# Patient Record
Sex: Female | Born: 2005
Health system: Southern US, Community
[De-identification: ages and names within clinical notes are randomized; demographics above are authoritative.]

## PROBLEM LIST (undated history)

## (undated) DIAGNOSIS — K5900 Constipation, unspecified: Secondary | ICD-10-CM

---
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
## 2023-01-03 DIAGNOSIS — M25562 Pain in left knee: Secondary | ICD-10-CM | POA: Diagnosis not present

## 2023-01-10 DIAGNOSIS — M25562 Pain in left knee: Secondary | ICD-10-CM | POA: Diagnosis not present

## 2023-01-13 DIAGNOSIS — M25562 Pain in left knee: Secondary | ICD-10-CM | POA: Diagnosis not present

## 2023-01-17 DIAGNOSIS — M25562 Pain in left knee: Secondary | ICD-10-CM | POA: Diagnosis not present

## 2023-01-20 DIAGNOSIS — M25562 Pain in left knee: Secondary | ICD-10-CM | POA: Diagnosis not present

## 2023-01-22 DIAGNOSIS — M25562 Pain in left knee: Secondary | ICD-10-CM | POA: Diagnosis not present

## 2023-01-28 DIAGNOSIS — M25562 Pain in left knee: Secondary | ICD-10-CM | POA: Diagnosis not present

## 2023-01-31 DIAGNOSIS — M25562 Pain in left knee: Secondary | ICD-10-CM | POA: Diagnosis not present

## 2023-02-04 DIAGNOSIS — Z4789 Encounter for other orthopedic aftercare: Secondary | ICD-10-CM | POA: Diagnosis not present

## 2023-02-07 DIAGNOSIS — J00 Acute nasopharyngitis [common cold]: Secondary | ICD-10-CM | POA: Diagnosis not present

## 2023-02-07 DIAGNOSIS — J309 Allergic rhinitis, unspecified: Secondary | ICD-10-CM | POA: Diagnosis not present

## 2023-02-07 DIAGNOSIS — K589 Irritable bowel syndrome without diarrhea: Secondary | ICD-10-CM | POA: Diagnosis not present

## 2023-02-07 DIAGNOSIS — M25562 Pain in left knee: Secondary | ICD-10-CM | POA: Diagnosis not present

## 2023-02-12 DIAGNOSIS — M25562 Pain in left knee: Secondary | ICD-10-CM | POA: Diagnosis not present

## 2023-02-14 DIAGNOSIS — M25562 Pain in left knee: Secondary | ICD-10-CM | POA: Diagnosis not present

## 2023-02-18 DIAGNOSIS — M25562 Pain in left knee: Secondary | ICD-10-CM | POA: Diagnosis not present

## 2023-02-21 ENCOUNTER — Encounter: Payer: Self-pay | Admitting: Gastroenterology

## 2023-02-21 DIAGNOSIS — M25562 Pain in left knee: Secondary | ICD-10-CM | POA: Diagnosis not present

## 2023-02-25 DIAGNOSIS — M25562 Pain in left knee: Secondary | ICD-10-CM | POA: Diagnosis not present

## 2023-02-28 DIAGNOSIS — M25562 Pain in left knee: Secondary | ICD-10-CM | POA: Diagnosis not present

## 2023-03-03 DIAGNOSIS — R5382 Chronic fatigue, unspecified: Secondary | ICD-10-CM | POA: Diagnosis not present

## 2023-03-03 DIAGNOSIS — N926 Irregular menstruation, unspecified: Secondary | ICD-10-CM | POA: Diagnosis not present

## 2023-03-03 DIAGNOSIS — E559 Vitamin D deficiency, unspecified: Secondary | ICD-10-CM | POA: Diagnosis not present

## 2023-03-03 DIAGNOSIS — Z0000 Encounter for general adult medical examination without abnormal findings: Secondary | ICD-10-CM | POA: Diagnosis not present

## 2023-03-03 DIAGNOSIS — K589 Irritable bowel syndrome without diarrhea: Secondary | ICD-10-CM | POA: Diagnosis not present

## 2023-03-04 DIAGNOSIS — M25562 Pain in left knee: Secondary | ICD-10-CM | POA: Diagnosis not present

## 2023-03-06 DIAGNOSIS — M25562 Pain in left knee: Secondary | ICD-10-CM | POA: Diagnosis not present

## 2023-03-11 DIAGNOSIS — M25562 Pain in left knee: Secondary | ICD-10-CM | POA: Diagnosis not present

## 2023-03-13 ENCOUNTER — Emergency Department (HOSPITAL_BASED_OUTPATIENT_CLINIC_OR_DEPARTMENT_OTHER): Payer: BC Managed Care – PPO

## 2023-03-13 ENCOUNTER — Emergency Department (HOSPITAL_BASED_OUTPATIENT_CLINIC_OR_DEPARTMENT_OTHER)
Admission: EM | Admit: 2023-03-13 | Discharge: 2023-03-14 | Disposition: A | Discharge status: Home / Self Care | Payer: BC Managed Care – PPO | Attending: Emergency Medicine | Admitting: Emergency Medicine

## 2023-03-13 ENCOUNTER — Encounter (HOSPITAL_BASED_OUTPATIENT_CLINIC_OR_DEPARTMENT_OTHER): Payer: Self-pay

## 2023-03-13 ENCOUNTER — Other Ambulatory Visit: Payer: Self-pay

## 2023-03-13 DIAGNOSIS — R109 Unspecified abdominal pain: Secondary | ICD-10-CM | POA: Diagnosis not present

## 2023-03-13 DIAGNOSIS — K219 Gastro-esophageal reflux disease without esophagitis: Secondary | ICD-10-CM | POA: Insufficient documentation

## 2023-03-13 DIAGNOSIS — M25562 Pain in left knee: Secondary | ICD-10-CM | POA: Diagnosis not present

## 2023-03-13 DIAGNOSIS — R1031 Right lower quadrant pain: Secondary | ICD-10-CM | POA: Diagnosis not present

## 2023-03-13 LAB — CBC WITH DIFFERENTIAL/PLATELET
Abs Immature Granulocytes: 0.01 10*3/uL (ref 0.00–0.07)
Basophils Absolute: 0 10*3/uL (ref 0.0–0.1)
Basophils Relative: 0 %
Eosinophils Absolute: 0.1 10*3/uL (ref 0.0–0.5)
Eosinophils Relative: 1 %
HCT: 42.7 % (ref 36.0–46.0)
Hemoglobin: 14.5 g/dL (ref 12.0–15.0)
Immature Granulocytes: 0 %
Lymphocytes Relative: 38 %
Lymphs Abs: 2.3 10*3/uL (ref 0.7–4.0)
MCH: 28 pg (ref 26.0–34.0)
MCHC: 34 g/dL (ref 30.0–36.0)
MCV: 82.4 fL (ref 80.0–100.0)
Monocytes Absolute: 0.4 10*3/uL (ref 0.1–1.0)
Monocytes Relative: 7 %
Neutro Abs: 3.3 10*3/uL (ref 1.7–7.7)
Neutrophils Relative %: 54 %
Platelets: 252 10*3/uL (ref 150–400)
RBC: 5.18 MIL/uL — ABNORMAL HIGH (ref 3.87–5.11)
RDW: 11.9 % (ref 11.5–15.5)
WBC: 6.2 10*3/uL (ref 4.0–10.5)
nRBC: 0 % (ref 0.0–0.2)

## 2023-03-13 LAB — URINALYSIS, MICROSCOPIC (REFLEX): WBC, UA: NONE SEEN WBC/hpf (ref 0–5)

## 2023-03-13 LAB — URINALYSIS, ROUTINE W REFLEX MICROSCOPIC
Bilirubin Urine: NEGATIVE
Glucose, UA: NEGATIVE mg/dL
Ketones, ur: NEGATIVE mg/dL
Leukocytes,Ua: NEGATIVE
Nitrite: NEGATIVE
Protein, ur: NEGATIVE mg/dL
Specific Gravity, Urine: 1.01 (ref 1.005–1.030)
pH: 6.5 (ref 5.0–8.0)

## 2023-03-13 LAB — COMPREHENSIVE METABOLIC PANEL
ALT: 13 U/L (ref 0–44)
AST: 19 U/L (ref 15–41)
Albumin: 4.5 g/dL (ref 3.5–5.0)
Alkaline Phosphatase: 54 U/L (ref 38–126)
Anion gap: 10 (ref 5–15)
BUN: 10 mg/dL (ref 6–20)
CO2: 24 mmol/L (ref 22–32)
Calcium: 9.3 mg/dL (ref 8.9–10.3)
Chloride: 105 mmol/L (ref 98–111)
Creatinine, Ser: 0.63 mg/dL (ref 0.44–1.00)
GFR, Estimated: >60 mL/min (ref >60)
Glucose, Bld: 111 mg/dL — ABNORMAL HIGH (ref 70–99)
Potassium: 3.7 mmol/L (ref 3.5–5.1)
Sodium: 139 mmol/L (ref 135–145)
Total Bilirubin: 0.5 mg/dL (ref 0.3–1.2)
Total Protein: 7.7 g/dL (ref 6.5–8.1)

## 2023-03-13 LAB — LIPASE, BLOOD: Lipase: 39 U/L (ref 11–51)

## 2023-03-13 LAB — PREGNANCY, URINE: Preg Test, Ur: NEGATIVE

## 2023-03-13 MED ORDER — IOHEXOL 300 MG/ML  SOLN
100.0000 mL | Freq: Once | INTRAMUSCULAR | Status: AC | PRN
  Administered 2023-03-13: 100 mL via INTRAVENOUS

## 2023-03-13 MED ORDER — ONDANSETRON 4 MG PO TBDP
4.0000 mg | ORAL_TABLET | Freq: Once | ORAL | Status: AC
  Administered 2023-03-13: 4 mg via ORAL
  Filled 2023-03-13: qty 1

## 2023-03-13 MED ORDER — ALUM & MAG HYDROXIDE-SIMETH 200-200-20 MG/5ML PO SUSP
30.0000 mL | Freq: Once | ORAL | Status: AC
  Administered 2023-03-13: 30 mL via ORAL
  Filled 2023-03-13: qty 30

## 2023-03-13 NOTE — ED Provider Notes (Signed)
[place] EMERGENCY DEPARTMENT AT [facility]  Provider Note  CSN: [id number] Arrival date & time: 03/13/23 2100  History Chief Complaint  Patient presents with   Abdominal Pain    Kelly Mcintyre is a 17 y.o. female with prior history of ovarian cyst (at 17yo), GERD and constipation has had increasing abdominal issues and irregular periods for some time. She has seen PCP and referred to GI and Ob/Gyn for further evaluation. She reports in the last 3 days she has had moderate aching RLQ abdominal discomfort as well as burning epigastric pain moving into her chest, worse when she lies down and keeping her from sleeping well at night. She has felt 'shaky' at times. No fevers, no vomiting, no diarrhea. No dysuria or hematuria. She is currently on her menses.    Home Medications Prior to Admission medications   Medication Sig Start Date End Date Taking? Authorizing Provider  Dapsone 5 % topical gel Apply daily to the face 03/18/18   [provider]  ISOtretinoin (ACCUTANE) 40 MG capsule Take by mouth. 10/04/20   [provider]  ketoconazole (NIZORAL) 2 % shampoo Apply topically. 03/19/18   [provider]  loratadine (CLARITIN) 10 MG tablet Take by mouth.    [provider]  tretinoin (RETIN-A) 0.025 % cream  07/21/18   [provider]     Allergies    Patient has no known allergies.   Review of Systems   Review of Systems Please see HPI for pertinent positives and negatives  Physical Exam BP 113/74   Pulse 81   Temp 98.1 F (36.7 C) (Oral)   Resp 18   Ht 5\' 5"  (1.651 m)   Wt 64.4 kg   SpO2 100%   BMI 23.63 kg/m   Physical Exam Vitals and nursing note reviewed.  Constitutional:      Appearance: Normal appearance.  HENT:     Head: Normocephalic and atraumatic.     Nose: Nose normal.     Mouth/Throat:     Mouth: Mucous membranes are moist.  Eyes:     Extraocular Movements: Extraocular movements intact.      Conjunctiva/sclera: Conjunctivae normal.  Cardiovascular:     Rate and Rhythm: Normal rate.  Pulmonary:     Effort: Pulmonary effort is normal.     Breath sounds: Normal breath sounds.  Abdominal:     General: Abdomen is flat.     Palpations: Abdomen is soft.     Tenderness: There is abdominal tenderness in the right lower quadrant. There is no guarding. Negative signs include Murphy's sign and McBurney's sign.  Musculoskeletal:        General: No swelling. Normal range of motion.     Cervical back: Neck supple.  Skin:    General: Skin is warm and dry.  Neurological:     General: No focal deficit present.     Mental Status: She is alert.  Psychiatric:        Mood and Affect: Mood normal.     ED Results / Procedures / Treatments   EKG EKG Interpretation  Date/Time:  Thursday March 13 2023 21:35:14 EDT Ventricular Rate:  99 PR Interval:  130 QRS Duration: 60 QT Interval:  315 QTC Calculation: 405 R Axis:   83 Text Interpretation: Sinus rhythm Borderline ST elevation, inferior leads No significant change since last tracing Confirmed by Susy Frizzle [phone]) on 03/13/2023 11:18:02 PM  Procedures Procedures  Medications Ordered in the ED Medications  ondansetron (ZOFRAN-ODT) disintegrating tablet 4 mg (4 mg Oral Given 03/13/23 2143)  alum & mag hydroxide-simeth (MAALOX/MYLANTA) 200-200-20 MG/5ML suspension 30 mL (30 mLs Oral Given 03/13/23 2330)  iohexol (OMNIPAQUE) 300 MG/ML solution 100 mL (100 mLs Intravenous Contrast Given 03/13/23 2345)    Initial Impression and Plan  Patient here with multiple complaints, some of which are more chronic or subacute such as her GERD symptoms and shakiness. She has had thyroid checked at PCP, reportedly normal. Awaiting outpatient GI and Ob/Gyn follow up for further investigation. She mentions she has not taken her PPI for several days and symptoms seemed to have worsened. Her more acute concern tonight is RLQ pain, some tenderness but no  peritoneal signs. Labs done in triage show normal CBC, CMP and Lipase. HCG is neg. Will send for CT to eval appendicitis or ovarian cyst. GI cocktail for symptom relief.   ED Course   Clinical Course as of 03/14/23 0046  Thu Mar 13, 2023  2339 UA with some blood, no infection, likely due to menses.  [CS]  Fri Mar 14, 2023  2440 I personally viewed the images from radiology studies and agree with radiologist interpretation:  CT neg for acute process. Patient aware of diverticulosis. She was encouraged to restart PPI, OTC antacids as needed. PCP follow up, RTED for any other concerns.   [CS]    Clinical Course User Index [CS] Pollyann Savoy, MD     MDM Rules/Calculators/A&P Medical Decision Making Problems Addressed: Gastroesophageal reflux disease, unspecified whether esophagitis present: chronic illness or injury with exacerbation, progression, or side effects of treatment Right lower quadrant abdominal pain: acute illness or injury  Amount and/or Complexity of Data Reviewed Labs: ordered. Decision-making details documented in ED Course. Radiology: ordered and independent interpretation performed. Decision-making details documented in ED Course. ECG/medicine tests: ordered and independent interpretation performed. Decision-making details documented in ED Course.  Risk OTC drugs. Prescription drug management.     Final Clinical Impression(s) / ED Diagnoses Final diagnoses:  Right lower quadrant abdominal pain  Gastroesophageal reflux disease, unspecified whether esophagitis present    Rx / DC Orders ED Discharge Orders     None        Pollyann Savoy, MD 03/14/23 [phone]

## 2023-03-13 NOTE — ED Notes (Signed)
Pt unable to void at this time. 

## 2023-03-13 NOTE — ED Triage Notes (Signed)
Pt reports epigastric pain (worse while laying on back), nausea, acid reflux, hands feeling shaky, and inability to sleep for the past 3 days. Pt also reports being on her menstrual cycle and having right lower abdominal pain. Pt reports alternating hot/cold flashes.

## 2023-03-20 DIAGNOSIS — M25562 Pain in left knee: Secondary | ICD-10-CM | POA: Diagnosis not present

## 2023-03-24 DIAGNOSIS — F419 Anxiety disorder, unspecified: Secondary | ICD-10-CM | POA: Diagnosis not present

## 2023-03-24 DIAGNOSIS — M25562 Pain in left knee: Secondary | ICD-10-CM | POA: Diagnosis not present

## 2023-03-28 DIAGNOSIS — K9289 Other specified diseases of the digestive system: Secondary | ICD-10-CM | POA: Diagnosis not present

## 2023-03-28 DIAGNOSIS — K219 Gastro-esophageal reflux disease without esophagitis: Secondary | ICD-10-CM | POA: Diagnosis not present

## 2023-03-28 DIAGNOSIS — K5909 Other constipation: Secondary | ICD-10-CM | POA: Diagnosis not present

## 2023-03-31 DIAGNOSIS — M25562 Pain in left knee: Secondary | ICD-10-CM | POA: Diagnosis not present

## 2023-04-02 DIAGNOSIS — M25562 Pain in left knee: Secondary | ICD-10-CM | POA: Diagnosis not present

## 2023-04-07 DIAGNOSIS — Z6823 Body mass index (BMI) 23.0-23.9, adult: Secondary | ICD-10-CM | POA: Diagnosis not present

## 2023-04-07 DIAGNOSIS — N915 Oligomenorrhea, unspecified: Secondary | ICD-10-CM | POA: Diagnosis not present

## 2023-04-08 DIAGNOSIS — M25562 Pain in left knee: Secondary | ICD-10-CM | POA: Diagnosis not present

## 2023-04-08 DIAGNOSIS — M7652 Patellar tendinitis, left knee: Secondary | ICD-10-CM | POA: Diagnosis not present

## 2023-04-08 DIAGNOSIS — Z9889 Other specified postprocedural states: Secondary | ICD-10-CM | POA: Diagnosis not present

## 2023-04-09 DIAGNOSIS — K219 Gastro-esophageal reflux disease without esophagitis: Secondary | ICD-10-CM | POA: Diagnosis not present

## 2023-04-11 DIAGNOSIS — M25562 Pain in left knee: Secondary | ICD-10-CM | POA: Diagnosis not present

## 2023-04-14 DIAGNOSIS — M25562 Pain in left knee: Secondary | ICD-10-CM | POA: Diagnosis not present

## 2023-04-16 DIAGNOSIS — M25562 Pain in left knee: Secondary | ICD-10-CM | POA: Diagnosis not present

## 2023-04-22 DIAGNOSIS — M25562 Pain in left knee: Secondary | ICD-10-CM | POA: Diagnosis not present

## 2023-04-25 DIAGNOSIS — M25562 Pain in left knee: Secondary | ICD-10-CM | POA: Diagnosis not present

## 2023-04-29 ENCOUNTER — Ambulatory Visit: Payer: BC Managed Care – PPO | Admitting: Gastroenterology

## 2023-04-29 DIAGNOSIS — M25562 Pain in left knee: Secondary | ICD-10-CM | POA: Diagnosis not present

## 2023-05-06 DIAGNOSIS — M25562 Pain in left knee: Secondary | ICD-10-CM | POA: Diagnosis not present

## 2023-05-13 DIAGNOSIS — M25562 Pain in left knee: Secondary | ICD-10-CM | POA: Diagnosis not present

## 2023-05-14 DIAGNOSIS — L700 Acne vulgaris: Secondary | ICD-10-CM | POA: Diagnosis not present

## 2023-05-20 DIAGNOSIS — M7652 Patellar tendinitis, left knee: Secondary | ICD-10-CM | POA: Diagnosis not present

## 2023-05-20 DIAGNOSIS — M25562 Pain in left knee: Secondary | ICD-10-CM | POA: Diagnosis not present

## 2023-05-29 DIAGNOSIS — K581 Irritable bowel syndrome with constipation: Secondary | ICD-10-CM | POA: Diagnosis not present

## 2023-05-29 DIAGNOSIS — K219 Gastro-esophageal reflux disease without esophagitis: Secondary | ICD-10-CM | POA: Diagnosis not present

## 2023-05-29 DIAGNOSIS — K921 Melena: Secondary | ICD-10-CM | POA: Diagnosis not present

## 2023-06-11 DIAGNOSIS — M25562 Pain in left knee: Secondary | ICD-10-CM | POA: Diagnosis not present

## 2023-06-25 DIAGNOSIS — M25562 Pain in left knee: Secondary | ICD-10-CM | POA: Diagnosis not present

## 2023-07-09 DIAGNOSIS — M25562 Pain in left knee: Secondary | ICD-10-CM | POA: Diagnosis not present

## 2023-07-29 DIAGNOSIS — M25562 Pain in left knee: Secondary | ICD-10-CM | POA: Diagnosis not present

## 2023-08-13 DIAGNOSIS — K640 First degree hemorrhoids: Secondary | ICD-10-CM | POA: Diagnosis not present

## 2023-08-13 DIAGNOSIS — K625 Hemorrhage of anus and rectum: Secondary | ICD-10-CM | POA: Diagnosis not present

## 2023-08-20 DIAGNOSIS — M25562 Pain in left knee: Secondary | ICD-10-CM | POA: Diagnosis not present

## 2023-08-31 DIAGNOSIS — H6692 Otitis media, unspecified, left ear: Secondary | ICD-10-CM | POA: Diagnosis not present

## 2023-09-02 DIAGNOSIS — K219 Gastro-esophageal reflux disease without esophagitis: Secondary | ICD-10-CM | POA: Diagnosis not present

## 2023-09-02 DIAGNOSIS — K581 Irritable bowel syndrome with constipation: Secondary | ICD-10-CM | POA: Diagnosis not present

## 2024-05-10 DIAGNOSIS — Z13220 Encounter for screening for lipoid disorders: Secondary | ICD-10-CM | POA: Diagnosis not present

## 2024-05-10 DIAGNOSIS — Z131 Encounter for screening for diabetes mellitus: Secondary | ICD-10-CM | POA: Diagnosis not present

## 2024-05-10 DIAGNOSIS — Z1331 Encounter for screening for depression: Secondary | ICD-10-CM | POA: Diagnosis not present

## 2024-05-10 DIAGNOSIS — N946 Dysmenorrhea, unspecified: Secondary | ICD-10-CM | POA: Diagnosis not present

## 2024-05-10 DIAGNOSIS — N938 Other specified abnormal uterine and vaginal bleeding: Secondary | ICD-10-CM | POA: Diagnosis not present
